# Patient Record
Sex: Male | Born: 1990 | Race: White | Hispanic: No | Marital: Single | Smoking: Current every day smoker
Health system: Southern US, Community
[De-identification: ages and names within clinical notes are randomized; demographics above are authoritative.]

---
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
## 2012-06-02 ENCOUNTER — Emergency Department (HOSPITAL_COMMUNITY): Payer: Self-pay

## 2012-06-02 ENCOUNTER — Emergency Department (HOSPITAL_COMMUNITY)
Admission: EM | Admit: 2012-06-02 | Discharge: 2012-06-02 | Disposition: A | Discharge status: Home / Self Care | Payer: Self-pay | Attending: Emergency Medicine | Admitting: Emergency Medicine

## 2012-06-02 ENCOUNTER — Encounter (HOSPITAL_COMMUNITY): Payer: Self-pay | Admitting: Emergency Medicine

## 2012-06-02 DIAGNOSIS — S0083XA Contusion of other part of head, initial encounter: Secondary | ICD-10-CM

## 2012-06-02 DIAGNOSIS — S0003XA Contusion of scalp, initial encounter: Secondary | ICD-10-CM | POA: Insufficient documentation

## 2012-06-02 DIAGNOSIS — F17200 Nicotine dependence, unspecified, uncomplicated: Secondary | ICD-10-CM | POA: Insufficient documentation

## 2012-06-02 DIAGNOSIS — Y92410 Unspecified street and highway as the place of occurrence of the external cause: Secondary | ICD-10-CM | POA: Insufficient documentation

## 2012-06-02 MED ORDER — HYDROCODONE-ACETAMINOPHEN 5-325 MG PO TABS
1.0000 | ORAL_TABLET | Freq: Once | ORAL | Status: AC
  Administered 2012-06-02: 1 via ORAL
  Filled 2012-06-02: qty 1

## 2012-06-02 MED ORDER — CYCLOBENZAPRINE HCL 5 MG PO TABS: 5.0000 mg | ORAL_TABLET | Freq: Three times a day (TID) | ORAL | Status: DC | PRN

## 2012-06-02 MED ORDER — IBUPROFEN 800 MG PO TABS: 800.0000 mg | ORAL_TABLET | Freq: Three times a day (TID) | ORAL | Status: DC | PRN

## 2012-06-02 MED ORDER — CYCLOBENZAPRINE HCL 5 MG PO TABS: 5.0000 mg | ORAL_TABLET | Freq: Three times a day (TID) | ORAL | Status: AC | PRN

## 2012-06-02 MED ORDER — IBUPROFEN 800 MG PO TABS: 800.0000 mg | ORAL_TABLET | Freq: Three times a day (TID) | ORAL | Status: AC | PRN

## 2012-06-02 MED ORDER — HYDROCODONE-ACETAMINOPHEN 5-325 MG PO TABS: 1.0000 | ORAL_TABLET | Freq: Four times a day (QID) | ORAL | Status: AC | PRN

## 2012-06-02 MED ORDER — IBUPROFEN 400 MG PO TABS
400.0000 mg | ORAL_TABLET | Freq: Once | ORAL | Status: AC
  Administered 2012-06-02: 400 mg via ORAL
  Filled 2012-06-02: qty 1

## 2012-06-02 MED ORDER — HYDROCODONE-ACETAMINOPHEN 5-325 MG PO TABS: 1.0000 | ORAL_TABLET | Freq: Four times a day (QID) | ORAL | Status: DC | PRN
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
## 2012-06-02 NOTE — ED Notes (Signed)
Patient back from CT.
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
## 2012-06-02 NOTE — ED Provider Notes (Signed)
Medical screening examination/treatment/procedure(s) were performed by non-physician practitioner and as supervising physician I was immediately available for consultation/collaboration.  Naiara Lombardozzi K Terrie Grajales, MD 06/02/12 0707 
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
## 2021-01-30 ENCOUNTER — Other Ambulatory Visit: Payer: Self-pay

## 2021-01-30 ENCOUNTER — Ambulatory Visit (HOSPITAL_COMMUNITY)
Admission: EM | Admit: 2021-01-30 | Discharge: 2021-01-30 | Disposition: A | Discharge status: Home / Self Care | Payer: No Payment, Other | Attending: Registered Nurse | Admitting: Registered Nurse

## 2021-01-30 DIAGNOSIS — R45851 Suicidal ideations: Secondary | ICD-10-CM | POA: Diagnosis not present

## 2021-01-30 DIAGNOSIS — F1024 Alcohol dependence with alcohol-induced mood disorder: Secondary | ICD-10-CM | POA: Diagnosis not present

## 2021-01-30 DIAGNOSIS — F1020 Alcohol dependence, uncomplicated: Secondary | ICD-10-CM | POA: Diagnosis present

## 2021-01-30 DIAGNOSIS — F1220 Cannabis dependence, uncomplicated: Secondary | ICD-10-CM | POA: Diagnosis present

## 2021-01-30 DIAGNOSIS — F1094 Alcohol use, unspecified with alcohol-induced mood disorder: Secondary | ICD-10-CM | POA: Diagnosis present
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
## 2021-01-30 NOTE — Discharge Instructions (Signed)
You have been scheduled for an intake appointment for the Chemical Dependence Intensive Outpatient Program (CD-IOP) for 4/5 at 9AM.  [facility] (2nd Floor) [address]. [place], [place] [phone]  In the mean time, you are encouraged to follow up for outpatient therapy during walk in hours.
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
## 2021-01-30 NOTE — Progress Notes (Signed)
Patient received AVS, with follow up instructions. Discharged from [facility].

## 2021-01-30 NOTE — ED Notes (Signed)
Patient A&O x 4, ambulatory. Patient discharged in no acute distress. Patient denied SI/HI, A/VH upon discharge. Patient verbalized understanding of all discharge instructions explained by staff, to include follow up appointments and safety plan. Pt belongings returned to patient from locker intact. Patient escorted to lobby via staff for transport to destination. Safety maintained.

## 2021-02-01 ENCOUNTER — Telehealth (HOSPITAL_COMMUNITY): Payer: Self-pay | Admitting: General Practice

## 2021-02-01 NOTE — BH Assessment (Signed)
Care Management - Follow Up [facility] Discharges   Writer made contact with the patient.  Patient reports that he has not made an appointment with a psychiatrist or therapist.   Writer provided patient with the following resources:       Writer informed patient that she is able to come to [facility] without an appointment Mon thru Thurs from 8am to 11am.  Writer stressed that these appointments are first come first serve.

## 2021-02-12 ENCOUNTER — Ambulatory Visit (HOSPITAL_COMMUNITY): Payer: Self-pay | Admitting: Behavioral Health
